# Patient Record
Sex: Male | Born: 1965 | Race: White | Hispanic: No | Marital: Single | Smoking: Never smoker
Health system: Southern US, Community
[De-identification: ages and names within clinical notes are randomized; demographics above are authoritative.]

## PROBLEM LIST (undated history)

## (undated) DIAGNOSIS — E785 Hyperlipidemia, unspecified: Secondary | ICD-10-CM

## (undated) DIAGNOSIS — M542 Cervicalgia: Secondary | ICD-10-CM

## (undated) DIAGNOSIS — M545 Low back pain: Secondary | ICD-10-CM

## (undated) HISTORY — DX: Low back pain: M54.5

## (undated) HISTORY — DX: Hyperlipidemia, unspecified: E78.5

## (undated) HISTORY — DX: Cervicalgia: M54.2

---

## 1996-05-17 HISTORY — PX: INGUINAL HERNIA REPAIR: SHX194

## 1998-01-10 ENCOUNTER — Encounter: Admission: RE | Admit: 1998-01-10 | Discharge: 1998-01-10 | Payer: Self-pay | Admitting: Sports Medicine

## 1999-08-16 ENCOUNTER — Ambulatory Visit (HOSPITAL_COMMUNITY): Admission: RE | Admit: 1999-08-16 | Discharge: 1999-08-16 | Payer: Self-pay | Admitting: Neurosurgery

## 1999-08-16 ENCOUNTER — Encounter: Payer: Self-pay | Admitting: Neurosurgery

## 2000-10-18 ENCOUNTER — Ambulatory Visit (HOSPITAL_BASED_OUTPATIENT_CLINIC_OR_DEPARTMENT_OTHER): Admission: RE | Admit: 2000-10-18 | Discharge: 2000-10-18 | Payer: Self-pay | Admitting: Family Medicine

## 2003-02-06 ENCOUNTER — Encounter: Payer: Self-pay | Admitting: Emergency Medicine

## 2003-02-06 ENCOUNTER — Emergency Department (HOSPITAL_COMMUNITY): Admission: EM | Admit: 2003-02-06 | Discharge: 2003-02-06 | Payer: Self-pay | Admitting: Emergency Medicine

## 2004-04-27 ENCOUNTER — Ambulatory Visit: Payer: Self-pay | Admitting: Pulmonary Disease

## 2004-11-27 ENCOUNTER — Encounter: Admission: RE | Admit: 2004-11-27 | Discharge: 2004-11-27 | Payer: Self-pay | Admitting: Sports Medicine

## 2004-11-27 ENCOUNTER — Ambulatory Visit: Payer: Self-pay | Admitting: Sports Medicine

## 2004-12-01 ENCOUNTER — Encounter: Admission: RE | Admit: 2004-12-01 | Discharge: 2004-12-01 | Payer: Self-pay | Admitting: Sports Medicine

## 2005-01-08 ENCOUNTER — Ambulatory Visit: Payer: Self-pay | Admitting: Sports Medicine

## 2005-05-17 DIAGNOSIS — M545 Low back pain, unspecified: Secondary | ICD-10-CM

## 2005-05-17 HISTORY — DX: Low back pain, unspecified: M54.50

## 2011-01-27 ENCOUNTER — Telehealth: Payer: Self-pay | Admitting: Family Medicine

## 2011-01-27 ENCOUNTER — Ambulatory Visit (INDEPENDENT_AMBULATORY_CARE_PROVIDER_SITE_OTHER): Payer: PRIVATE HEALTH INSURANCE | Admitting: Family Medicine

## 2011-01-27 ENCOUNTER — Encounter: Payer: Self-pay | Admitting: Family Medicine

## 2011-01-27 VITALS — BP 112/70 | HR 39 | Temp 97.5°F | Ht 68.75 in | Wt 175.0 lb

## 2011-01-27 DIAGNOSIS — Z0000 Encounter for general adult medical examination without abnormal findings: Secondary | ICD-10-CM | POA: Insufficient documentation

## 2011-01-27 DIAGNOSIS — M7540 Impingement syndrome of unspecified shoulder: Secondary | ICD-10-CM

## 2011-01-27 DIAGNOSIS — M719 Bursopathy, unspecified: Secondary | ICD-10-CM

## 2011-01-27 LAB — COMPREHENSIVE METABOLIC PANEL
AST: 34 U/L (ref 0–37)
Albumin: 4.8 g/dL (ref 3.5–5.2)
BUN: 16 mg/dL (ref 6–23)
CO2: 29 mEq/L (ref 19–32)
Calcium: 9.5 mg/dL (ref 8.4–10.5)
Chloride: 102 mEq/L (ref 96–112)
Glucose, Bld: 92 mg/dL (ref 70–99)
Potassium: 4.6 mEq/L (ref 3.5–5.1)

## 2011-01-27 LAB — CBC WITH DIFFERENTIAL/PLATELET
Basophils Absolute: 0 10*3/uL (ref 0.0–0.1)
Eosinophils Absolute: 0.1 10*3/uL (ref 0.0–0.7)
HCT: 42.5 % (ref 39.0–52.0)
Lymphs Abs: 1.3 10*3/uL (ref 0.7–4.0)
MCHC: 33.6 g/dL (ref 30.0–36.0)
MCV: 98.2 fl (ref 78.0–100.0)
Monocytes Absolute: 0.4 10*3/uL (ref 0.1–1.0)
Platelets: 181 10*3/uL (ref 150.0–400.0)
RDW: 13.5 % (ref 11.5–14.6)

## 2011-01-27 LAB — TSH: TSH: 1.09 u[IU]/mL (ref 0.35–5.50)

## 2011-01-27 LAB — LIPID PANEL: VLDL: 9.8 mg/dL (ref 0.0–40.0)

## 2011-01-27 MED ORDER — FLUTICASONE PROPIONATE (INHAL) 100 MCG/BLIST IN AEPB: 1.0000 | INHALATION_SPRAY | Freq: Two times a day (BID) | RESPIRATORY_TRACT | Status: DC

## 2011-01-27 MED ORDER — ALBUTEROL 90 MCG/ACT IN AERS: 2.0000 | INHALATION_SPRAY | RESPIRATORY_TRACT | Status: DC | PRN

## 2011-01-27 MED ORDER — TRIAMCINOLONE ACETONIDE(NASAL) 55 MCG/ACT NA INHA: 2.0000 | Freq: Every day | NASAL | Status: DC

## 2011-01-27 NOTE — Progress Notes (Signed)
Office Note 01/27/2011  CC:  Chief Complaint  Patient presents with  . Annual Exam    last CPE >94yr, <2 yr  . Shoulder Pain    right    HPI:  Antonio Paul is a 45 y.o. White male who is here to establish care, get CPE, and discuss right shoulder pain. Patient's most recent primary MD: Dr. Izola Price at [facility] [place] Old records were not reviewed prior to or during today's visit.  Has been over a year since last CPE.  He is in excellent shape.  Participates in triathalons.  He has a hx of asthma that has been mild and very well controlled in recent years.  Was on advair at one time but heard some negative press and asked to switch to inhaled steroid alone.  He has been doing well on flovent (?strength) 1 puff qhs and rarely has to use rescue inhaler.  Notes recurrent right shoulder pain: started after/during swimming, without dislocation or traumatic injury. Hurt 1-2 days constantly after swimming so he would stop swimming for a week or two and it would go away.  Then it would return when he started swimming again. At this point it doesn't hurt at rest unless he has been swimming that day or the day prior.  Occasionally hurts some when trying to sleep at night, can't get comfortable. Has taken some NSAIDs without much effect, has been resting the shoulder lately. Reports hx of left shoulder pain in past but describes being dx'd with [facility] joint arthritis by Dr. Charlett Blake (ortho), and this got better with NSAIDs.   Past Medical History  Diagnosis Date  . Asthma   . Allergic rhinitis   . Low back pain 2007    hx of HNP L-spine, PT helped  . Neck pain     distant hx of neck pain; dx'd with 2 fused cervical vertebrae by Dr. Channing Mutters, no surgery required    Past Surgical History  Procedure Date  . Inguinal hernia repair 2000    Right    Family History  Problem Relation Age of Onset  . COPD Mother   . Heart disease Father   . Cancer Sister     d. breast cancer  . Heart disease  Paternal Grandfather   . Diabetes Paternal Grandfather   No FH of colon cancer or prostate cancer.  History   Social History  . Marital Status: Single    Spouse Name: N/A    Number of Children: N/A  . Years of Education: N/A   Occupational History  . Not on file.   Social History Main Topics  . Smoking status: Never Smoker   . Smokeless tobacco: Former Neurosurgeon  . Alcohol Use: Yes     occasional  . Drug Use: No  . Sexually Active: Not on file   Other Topics Concern  . 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  He eats an excellent diet: low fat, low chol.  Outpatient Encounter Prescriptions as of 01/27/2011  Medication Sig Dispense Refill  . Albuterol Sulfate (PROVENTIL HFA IN) Inhale 2 puffs into the lungs every 4 (four) hours as needed.        . cyanocobalamin (BL VITAMIN B-12) 500 MCG tablet Take 500 mcg by mouth daily.        Marland Kitchen  Multiple Vitamin (MULTIVITAMIN) tablet Take 1 tablet by mouth daily.        . Omega-3 Fatty Acids (FISH OIL) 1000 MG CAPS Take 2 capsules by mouth daily.        Marland Kitchen triamcinolone (NASACORT AQ) 55 MCG/ACT nasal inhaler Place 2 sprays into the nose daily.        Flovent diskus: pt does not recall strength but takes only 1 puff qhs  Allergies  Allergen Reactions  . Penicillins     ROS Review of Systems  Constitutional: Negative for fever, chills, appetite change and fatigue.  HENT: Negative for ear pain, congestion, sore throat, neck stiffness and dental problem.   Eyes: Negative for discharge, redness and visual disturbance.  Respiratory: Negative for cough, chest tightness, shortness of breath and wheezing.   Cardiovascular: Negative for chest pain, palpitations and leg swelling.  Gastrointestinal: Negative for nausea, vomiting, abdominal pain, diarrhea and blood in  stool.  Genitourinary: Negative for dysuria, urgency, frequency, hematuria, flank pain and difficulty urinating.  Musculoskeletal: Positive for arthralgias (right shoulder, as per HPI). Negative for myalgias, back pain and joint swelling.  Skin: Negative for pallor and rash.  Neurological: Negative for dizziness, speech difficulty, weakness and headaches.  Hematological: Negative for adenopathy. Does not bruise/bleed easily.  Psychiatric/Behavioral: Negative for confusion and sleep disturbance. The patient is not nervous/anxious.      PE; Blood pressure 112/70, pulse 39, temperature 97.5 F (36.4 C), temperature source Oral, height 5' 8.75" (1.746 m), weight 175 lb (79.379 kg), SpO2 98.00%. Gen: Alert, well appearing.  Patient is oriented to person, place, time, and situation. HEENT: Scalp without lesions or hair loss.  Ears: EACs clear, normal epithelium.  TMs with good light reflex and landmarks bilaterally.  Eyes: no injection, icteris, swelling, or exudate.  EOMI, PERRLA.  Fundoscopy: clear disc margins, normal appearing retinal vasculature. Nose: no drainage or turbinate edema/swelling.  No injection or focal lesion.  Mouth: lips without lesion/swelling.  Oral mucosa pink and moist.  Dentition intact and without obvious caries or gingival swelling.  Oropharynx without erythema, exudate, or swelling.  Neck: supple, ROM full.  Carotids 2+ bilat, without bruit.  No lymphadenopathy, thyromegaly, or mass. Chest: symmetric expansion, nonlabored respirations.  Clear and equal breath sounds in all lung fields.   CV: Regular rhythm, bradycardia (rate 40s), no m/r/g.  Peripheral pulses 2+ and symmetric. ABD: soft, NT, ND, BS normal.  No hepatospenomegaly or mass.  No bruits. EXT: no clubbing, cyanosis, or edema.  Neuro: CN 2-12 intact bilaterally, strength 5/5 in proximal and distal upper extremities and lower extremities bilaterally.  No sensory deficits.  No tremor.  No disdiadochokinesis.  No  ataxia.  Upper extremity and lower extremity DTRs symmetric.  No pronator drift. Skin - no sores or suspicious lesions or rashes or color changes MUSCULOSKELETAL: shoulders symmetric, without deformity or muscle atrophy.  Right shoulder without any TTP.  Pain with right shoulder ABduction, +impingement sign. ER/IR and flexion/extension elicit no pain.  Strength 5/5 prox/dist UE's bilat.  No sensory deficits.  Pertinent labs:  None today  ASSESSMENT AND PLAN:   Health maintenance examination Healthy and fit. Discussed screening/health maintenance appropriate for age.  He deferred testicular exam today. Continue excellent exercise habits and excellent diet. Check FLP, CBC, CMET, TSH. His asthma is well controlled.  Will refill current meds.  Rotator cuff impingement syndrome Mild sx's at this time.  Discussed regular anti-inflammitory use for 2 wks, light resistance ROM exercises, gradually get back into swimming. He  declined steroid injection and PT referral today but will call/return if he changes his mind or if things worsen/change. I gave samples (#15 caps) of 200mg  celebrex for him to take 1 daily until they are gone.  He can ask for more samples to take qd prn in the near future. Therapeutic expectations and side effect profile of medication discussed today.  Patient's questions answered.      Return in about 1 year (around 01/27/2012) for CPE.

## 2011-01-27 NOTE — Assessment & Plan Note (Addendum)
Healthy and fit. Discussed screening/health maintenance appropriate for age.  He deferred testicular exam today. Continue excellent exercise habits and excellent diet. Check FLP, CBC, CMET, TSH. His asthma is well controlled.  Will refill current meds.

## 2011-01-27 NOTE — Assessment & Plan Note (Signed)
Mild sx's at this time.  Discussed regular anti-inflammitory use for 2 wks, light resistance ROM exercises, gradually get back into swimming. He declined steroid injection and PT referral today but will call/return if he changes his mind or if things worsen/change. I gave samples (#15 caps) of 200mg  celebrex for him to take 1 daily until they are gone.  He can ask for more samples to take qd prn in the near future. Therapeutic expectations and side effect profile of medication discussed today.  Patient's questions answered.

## 2011-01-27 NOTE — Telephone Encounter (Signed)
Pls request records from Dr. Izola Price at [facility] [place].  Thx--PM

## 2011-01-29 ENCOUNTER — Other Ambulatory Visit: Payer: Self-pay | Admitting: Family Medicine

## 2011-01-29 DIAGNOSIS — E785 Hyperlipidemia, unspecified: Secondary | ICD-10-CM

## 2011-01-29 NOTE — Progress Notes (Signed)
Order placed--PM 

## 2011-02-01 ENCOUNTER — Other Ambulatory Visit: Payer: PRIVATE HEALTH INSURANCE

## 2011-02-01 DIAGNOSIS — E785 Hyperlipidemia, unspecified: Secondary | ICD-10-CM

## 2011-02-03 LAB — NMR LIPOPROFILE WITHOUT LIPIDS
HDL Particle Number: 37.7 umol/L
LDL Particle Number: 1489 nmol/L — ABNORMAL HIGH
LDL Size: 22.2 nm
LP-IR Score: 58 — ABNORMAL HIGH
Small LDL Particle Number: <90 nmol/L

## 2011-02-03 NOTE — Progress Notes (Signed)
Quick Note:  Pls notify: His advance lipid testing did show that he would benefit from lowering his cholesterol, especially with his family history of heart disease.  I recommend starting generic lipitor 20mg  once daily, repeat lipids/NMR in 48mo. If he's ok with this then let me know and I'll do eRx--PM ______

## 2011-02-05 ENCOUNTER — Other Ambulatory Visit: Payer: Self-pay | Admitting: Family Medicine

## 2011-02-05 MED ORDER — ATORVASTATIN CALCIUM 20 MG PO TABS: 20.0000 mg | ORAL_TABLET | Freq: Every day | ORAL | Status: DC

## 2011-02-09 NOTE — Telephone Encounter (Signed)
Faxed request

## 2011-02-25 ENCOUNTER — Encounter: Payer: Self-pay | Admitting: Family Medicine

## 2012-03-16 ENCOUNTER — Other Ambulatory Visit: Payer: Self-pay | Admitting: Family Medicine

## 2012-03-17 NOTE — Telephone Encounter (Signed)
eScribe request for refill on NASACORT, FLOVENT Last filled - 01/27/11 Last seen on - 01/27/11 RX sent with message needs appt for more refills.

## 2012-09-29 ENCOUNTER — Other Ambulatory Visit: Payer: Self-pay | Admitting: Family Medicine

## 2012-09-29 NOTE — Telephone Encounter (Signed)
Denial of Rx request-Pt needs Appointment--Patient last OV 09.12.2012/SLS

## 2012-10-02 ENCOUNTER — Other Ambulatory Visit: Payer: Self-pay | Admitting: Family Medicine

## 2012-10-02 MED ORDER — FLUTICASONE PROPIONATE (INHAL) 100 MCG/BLIST IN AEPB: 1.0000 | INHALATION_SPRAY | Freq: Two times a day (BID) | RESPIRATORY_TRACT | Status: DC

## 2012-10-02 NOTE — Telephone Encounter (Signed)
Refill request for Flovent Diskus Last filled by MD on - 03/17/12 #60 x0 Last seen-01/27/11 F/U-10/05/12 Please advise refill?

## 2012-10-05 ENCOUNTER — Ambulatory Visit (INDEPENDENT_AMBULATORY_CARE_PROVIDER_SITE_OTHER): Payer: PRIVATE HEALTH INSURANCE | Admitting: Family Medicine

## 2012-10-05 ENCOUNTER — Encounter: Payer: Self-pay | Admitting: Family Medicine

## 2012-10-05 ENCOUNTER — Telehealth: Payer: Self-pay | Admitting: Family Medicine

## 2012-10-05 VITALS — BP 128/88 | HR 44 | Temp 98.3°F | Ht 68.75 in | Wt 191.2 lb

## 2012-10-05 DIAGNOSIS — Z0000 Encounter for general adult medical examination without abnormal findings: Secondary | ICD-10-CM

## 2012-10-05 LAB — COMPREHENSIVE METABOLIC PANEL
ALT: 19 U/L (ref 0–53)
AST: 25 U/L (ref 0–37)
Alkaline Phosphatase: 42 U/L (ref 39–117)
BUN: 12 mg/dL (ref 6–23)
Calcium: 9.4 mg/dL (ref 8.4–10.5)
Chloride: 103 mEq/L (ref 96–112)
Creatinine, Ser: 0.8 mg/dL (ref 0.4–1.5)
Total Bilirubin: 0.8 mg/dL (ref 0.3–1.2)

## 2012-10-05 LAB — LIPID PANEL
Cholesterol: 235 mg/dL — ABNORMAL HIGH (ref 0–200)
HDL: 60.7 mg/dL (ref >39.00)
Total CHOL/HDL Ratio: 4
VLDL: 11.4 mg/dL (ref 0.0–40.0)

## 2012-10-05 LAB — CBC WITH DIFFERENTIAL/PLATELET
Basophils Relative: 1.2 % (ref 0.0–3.0)
Eosinophils Relative: 3.8 % (ref 0.0–5.0)
HCT: 42.3 % (ref 39.0–52.0)
MCV: 94.9 fl (ref 78.0–100.0)
Monocytes Absolute: 0.5 10*3/uL (ref 0.1–1.0)
Monocytes Relative: 11.9 % (ref 3.0–12.0)
Neutrophils Relative %: 50.3 % (ref 43.0–77.0)
RBC: 4.46 Mil/uL (ref 4.22–5.81)
WBC: 4.4 10*3/uL — ABNORMAL LOW (ref 4.5–10.5)

## 2012-10-05 MED ORDER — BUDESONIDE 180 MCG/ACT IN AEPB: 2.0000 | INHALATION_SPRAY | Freq: Every day | RESPIRATORY_TRACT | Status: DC

## 2012-10-05 NOTE — Assessment & Plan Note (Signed)
Reviewed age and gender appropriate health maintenance issues (prudent diet, regular exercise, health risks of tobacco and excessive alcohol, use of seatbelts, fire alarms in home, use of sunscreen).  Also reviewed age and gender appropriate health screening as well as vaccine recommendations. UTD on vaccines. Health panel labs done today. Gave pulmicort flexhaler sample today and we'll work on getting his Flovent approved through his insurer.

## 2012-10-05 NOTE — Telephone Encounter (Signed)
Patient informed, understood & agreed/SLS  

## 2012-10-05 NOTE — Progress Notes (Signed)
OFFICE NOTE  10/05/2012  CC:  Chief Complaint  Patient presents with  . Annual Exam     HPI: Patient is a 47 y.o. Caucasian male who is here for CPE.   Feeling very well.  Biking some, running some.  Was on vegan diet instead of trying the statin I rx'd for him and he lost some weight but then in the last 64mo has "fallen off" the diet and gained wt back--he is up 16 lb from last visit 01/2011. He has been having just a bit of chest tightness on and off lately, ran out of flovent----and we are working on getting him back on this.  Pertinent PMH:  Past Medical History  Diagnosis Date  . Asthma   . Allergic rhinitis   . Low back pain 2007    hx of HNP L-spine, PT helped  . Neck pain     distant hx of neck pain; dx'd with 2 fused cervical vertebrae by Dr. Channing Mutters, no surgery required  . Hyperlipidemia    Past Surgical History  Procedure Laterality Date  . Inguinal hernia repair  1998    Right   Family History  Problem Relation Age of Onset  . COPD Mother   . Heart disease Father   . Cancer Sister     d. breast cancer  . Heart disease Paternal Grandfather   . Diabetes Paternal Grandfather    History   Social History  . Marital Status: Single    Spouse Name: N/A    Number of Children: N/A  . Years of Education: N/A   Social History Main Topics  . Smoking status: Never Smoker   . Smokeless tobacco: Former Neurosurgeon  . Alcohol Use: Yes     Comment: occasional  . Drug Use: No  . Sexually Active: None   Other Topics Concern  . None   Social History Narrative   Married, has a 56 y/o daughter.   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   Grew up in [place], attended [employer], [employer] (masters).   He is a triathelete: runs, swims, bikes regularly.   No T/A/Ds.    MEDS:  Outpatient Prescriptions Prior to Visit  Medication Sig Dispense Refill  . albuterol (PROVENTIL,VENTOLIN) 90 MCG/ACT inhaler Inhale 2 puffs into the lungs every 4 (four) hours as needed for wheezing.  17 g   1  . cyanocobalamin (BL VITAMIN B-12) 500 MCG tablet Take 500 mcg by mouth daily.        . Fluticasone Propionate, Inhal, (FLOVENT DISKUS) 100 MCG/BLIST AEPB Inhale 1 puff into the lungs 2 (two) times daily. MUST HAVE APPT FOR MORE REFILLS.  60 each  0  . Multiple Vitamin (MULTIVITAMIN) tablet Take 1 tablet by mouth daily.        Marland Kitchen triamcinolone (NASACORT) 55 MCG/ACT nasal inhaler Place 2 sprays into the nose at bedtime. MUST HAVE APPT FOR MORE REFILLS.  16.5 g  0  . atorvastatin (LIPITOR) 20 MG tablet Take 1 tablet (20 mg total) by mouth daily.  30 tablet  3  . Omega-3 Fatty Acids (FISH OIL) 1000 MG CAPS Take 2 capsules by mouth daily.         No facility-administered medications prior to visit.   Review of Systems  Constitutional: Negative for fever, chills, appetite change and fatigue.  HENT: Negative for ear pain, congestion, sore throat, neck stiffness and dental problem.   Eyes: Negative for discharge, redness and visual disturbance.  Respiratory: Negative for  cough, chest tightness, shortness of breath and wheezing.   Cardiovascular: Negative for chest pain, palpitations and leg swelling.  Gastrointestinal: Negative for nausea, vomiting, abdominal pain, diarrhea and blood in stool.  Genitourinary: Negative for dysuria, urgency, frequency, hematuria, flank pain and difficulty urinating.  Musculoskeletal: Negative for myalgias, back pain, joint swelling and arthralgias.  Skin: Negative for pallor and rash.  Neurological: Negative for dizziness, speech difficulty, weakness and headaches.  Hematological: Negative for adenopathy. Does not bruise/bleed easily.  Psychiatric/Behavioral: Negative for confusion and sleep disturbance. The patient is not nervous/anxious.       PE: Blood pressure 128/88, pulse 44, temperature 98.3 F (36.8 C), temperature source Oral, height 5' 8.75" (1.746 m), weight 191 lb 4 oz (86.75 kg), SpO2 96.00%. Gen: Alert, well appearing.  Patient is oriented to  person, place, time, and situation. AFFECT: pleasant, lucid thought and speech. ENT: Ears: EACs clear, normal epithelium.  TMs with good light reflex and landmarks bilaterally.  Eyes: no injection, icteris, swelling, or exudate.  EOMI, PERRLA. Nose: no drainage or turbinate edema/swelling.  No injection or focal lesion.  Mouth: lips without lesion/swelling.  Oral mucosa pink and moist.  Dentition intact and without obvious caries or gingival swelling.  Oropharynx without erythema, exudate, or swelling.  Neck: supple/nontender.  No LAD, mass, or TM.  Carotid pulses 2+ bilaterally, without bruits. CV: Regular, bradycardic, no m/r/g.   LUNGS: CTA bilat, nonlabored resps, good aeration in all lung fields. ABD: soft, NT, ND, BS normal.  No hepatospenomegaly or mass.  No bruits. EXT: no clubbing, cyanosis, or edema.  Musculoskeletal: no joint swelling, erythema, warmth, or tenderness.  ROM of all joints intact. Skin - no sores or suspicious lesions or rashes or color changes Neuro: CN 2-12 intact bilaterally, strength 5/5 in proximal and distal upper extremities and lower extremities bilaterally.  No sensory deficits.  No tremor.  No disdiadochokinesis.  No ataxia.  Upper extremity and lower extremity DTRs symmetric.  No pronator drift. Genitals normal; both testes normal without tenderness, masses, hydroceles, varicoceles, erythema or swelling. Shaft normal, circumcised, meatus normal without discharge. No inguinal hernia noted. No inguinal lymphadenopathy.  LAB: none today  IMPRESSION AND PLAN:  Reviewed age and gender appropriate health maintenance issues (prudent diet, regular exercise, health risks of tobacco and excessive alcohol, use of seatbelts, fire alarms in home, use of sunscreen).  Also reviewed age and gender appropriate health screening as well as vaccine recommendations.  An After Visit Summary was printed and given to the patient.  FOLLOW UP: 1 yr

## 2012-10-05 NOTE — Telephone Encounter (Signed)
Pls notify pt that his insurance formulary prefers pulmicort flexhaler--the med I gave him a sample of today--2 puffs every day.  I'll send new eRx in and I'll take flovent off his med list.-thx

## 2012-10-06 LAB — LDL CHOLESTEROL, DIRECT: Direct LDL: 154.5 mg/dL

## 2012-11-27 ENCOUNTER — Ambulatory Visit (INDEPENDENT_AMBULATORY_CARE_PROVIDER_SITE_OTHER): Payer: PRIVATE HEALTH INSURANCE | Admitting: Family Medicine

## 2012-11-27 ENCOUNTER — Ambulatory Visit (INDEPENDENT_AMBULATORY_CARE_PROVIDER_SITE_OTHER)
Admission: RE | Admit: 2012-11-27 | Discharge: 2012-11-27 | Disposition: A | Discharge status: Home / Self Care | Payer: PRIVATE HEALTH INSURANCE | Source: Ambulatory Visit | Attending: Family Medicine | Admitting: Family Medicine

## 2012-11-27 ENCOUNTER — Encounter: Payer: Self-pay | Admitting: Family Medicine

## 2012-11-27 VITALS — BP 134/83 | HR 40 | Temp 99.0°F | Resp 16 | Ht 68.75 in | Wt 191.0 lb

## 2012-11-27 DIAGNOSIS — M79609 Pain in unspecified limb: Secondary | ICD-10-CM

## 2012-11-27 DIAGNOSIS — M79673 Pain in unspecified foot: Secondary | ICD-10-CM | POA: Insufficient documentation

## 2012-11-27 DIAGNOSIS — M79672 Pain in left foot: Secondary | ICD-10-CM

## 2012-11-27 NOTE — Progress Notes (Signed)
OFFICE NOTE  11/27/2012  CC:  Chief Complaint  Patient presents with  . Foot Pain    L foot x yesterday     HPI: Patient is a 47 y.o. Caucasian male who is here for pain in left foot.   Stubbed left foot 4th and 5th toes yesterday against a piece of furniture in his home.  No swelling.  Pain not that bad after, no swelling. He noted more soreness last night, esp with walking on it.  Then this morning while at work he felt a sharp pang of pain in the region of the left 4th and 5th metatarsals and thought he should come in to get it checked out. He has not iced it or taken any meds.  No ankle or knee pain.  No heel pain.  No sensory changes in foot, no focal weakness.  Pertinent PMH:  Past Medical History  Diagnosis Date  . Asthma   . Allergic rhinitis   . Low back pain 2007    hx of HNP L-spine, PT helped  . Neck pain     distant hx of neck pain; dx'd with 2 fused cervical vertebrae by Dr. Channing Mutters, no surgery required  . Hyperlipidemia    Past surgical, social, and family history reviewed and no changes noted since last office visit.  MEDS:  Outpatient Prescriptions Prior to Visit  Medication Sig Dispense Refill  . triamcinolone (NASACORT) 55 MCG/ACT nasal inhaler Place 2 sprays into the nose at bedtime. MUST HAVE APPT FOR MORE REFILLS.  16.5 g  0  . albuterol (PROVENTIL,VENTOLIN) 90 MCG/ACT inhaler Inhale 2 puffs into the lungs every 4 (four) hours as needed for wheezing.  17 g  1  . atorvastatin (LIPITOR) 20 MG tablet Take 1 tablet (20 mg total) by mouth daily.  30 tablet  3  . budesonide (PULMICORT FLEXHALER) 180 MCG/ACT inhaler Inhale 2 puffs into the lungs daily.  1 Inhaler  12  . cyanocobalamin (BL VITAMIN B-12) 500 MCG tablet Take 500 mcg by mouth daily.        . Multiple Vitamin (MULTIVITAMIN) tablet Take 1 tablet by mouth daily.        . Omega-3 Fatty Acids (FISH OIL) 1000 MG CAPS Take 2 capsules by mouth daily.         No facility-administered medications prior to visit.     PE: Blood pressure 134/83, pulse 40, temperature 99 F (37.2 C), temperature source Temporal, resp. rate 16, height 5' 8.75" (1.746 m), weight 191 lb (86.637 kg), SpO2 98.00%. Gen: Alert, well appearing.  Patient is oriented to person, place, time, and situation. Left foot: no erythema, bruising, swelling, or other deformity.  ROM of ankle and all toes intact.  DP and PT pulses intact.  Nontender to palpation over entire foot and all toes. When pt stands up to bear wt on the foot, he bends left knee some in response to pain felt in left lateral foot area proximal to the 4th and 5th toes (metatarsal region).  IMPRESSION AND PLAN:  Foot pain Suspect soft tissue injury but need to r/o fracture. X-ray here today:  Ibuprofen 600mg  bid with food recommended. Fitted with post-op shoe in office--dispensed shoe to pt today. Signs/symptoms to call or return for were reviewed and pt expressed understanding.    An After Visit Summary was printed and given to the patient.  FOLLOW UP: prn

## 2012-11-27 NOTE — Assessment & Plan Note (Addendum)
Suspect soft tissue injury but need to r/o fracture. X-ray here today: technical difficulties prevented prelim reading while pt was here today.  Will await radiology reading and call pt with result. Ibuprofen 600mg  bid with food recommended. Fitted with post-op shoe in office--dispensed shoe to pt today. Signs/symptoms to call or return for were reviewed and pt expressed understanding.

## 2013-03-22 ENCOUNTER — Other Ambulatory Visit: Payer: Self-pay

## 2013-08-10 ENCOUNTER — Ambulatory Visit (INDEPENDENT_AMBULATORY_CARE_PROVIDER_SITE_OTHER): Payer: PRIVATE HEALTH INSURANCE | Admitting: Family Medicine

## 2013-08-10 ENCOUNTER — Encounter: Payer: Self-pay | Admitting: Family Medicine

## 2013-08-10 VITALS — BP 137/88 | HR 43 | Temp 98.6°F | Resp 16 | Ht 68.75 in | Wt 192.0 lb

## 2013-08-10 DIAGNOSIS — IMO0002 Reserved for concepts with insufficient information to code with codable children: Secondary | ICD-10-CM

## 2013-08-10 DIAGNOSIS — S8391XA Sprain of unspecified site of right knee, initial encounter: Secondary | ICD-10-CM | POA: Insufficient documentation

## 2013-08-10 NOTE — Progress Notes (Addendum)
OFFICE NOTE  08/10/2013  CC:  Chief Complaint  Patient presents with  . Knee Pain    right knee     HPI: Patient is a 48 y.o. Caucasian male who is here for right knee pain. "Tweaked" his right knee recently doing deep squats with light wts, no swelling but it hurt for a couple days. Over the last 2 wks he has had recurring pain when running, most significantly the last week after he was walking and felt it go a bit unstable briefly.  No meds taken for it.  Has been icing it the last 2 nights.  No signif injury in his knees before.  Pertinent PMH:  Past medical, surgical, social, and family history reviewed and no changes are noted since last office visit.  MEDS:  Outpatient Prescriptions Prior to Visit  Medication Sig Dispense Refill  . budesonide (PULMICORT FLEXHALER) 180 MCG/ACT inhaler Inhale 2 puffs into the lungs daily.  1 Inhaler  12  . cyanocobalamin (BL VITAMIN B-12) 500 MCG tablet Take 500 mcg by mouth daily.        Marland Kitchen. triamcinolone (NASACORT) 55 MCG/ACT nasal inhaler Place 2 sprays into the nose at bedtime. MUST HAVE APPT FOR MORE REFILLS.  16.5 g  0  . albuterol (PROVENTIL,VENTOLIN) 90 MCG/ACT inhaler Inhale 2 puffs into the lungs every 4 (four) hours as needed for wheezing.  17 g  1  . atorvastatin (LIPITOR) 20 MG tablet Take 1 tablet (20 mg total) by mouth daily.  30 tablet  3  . Multiple Vitamin (MULTIVITAMIN) tablet Take 1 tablet by mouth daily.        . Omega-3 Fatty Acids (FISH OIL) 1000 MG CAPS Take 2 capsules by mouth daily.         No facility-administered medications prior to visit.    PE: Blood pressure 137/88, pulse 43, temperature 98.6 F (37 C), temperature source Temporal, resp. rate 16, height 5' 8.75" (1.746 m), weight 192 lb (87.091 kg), SpO2 98.00%. Gen: Alert, well appearing.  Patient is oriented to person, place, time, and situation. Right knee without erythema, warmth, tenderness, or deformity. Patellar grind negative.  McMurray's neg.  No  instability with varus or valgus stress or with lachman's.  IMPRESSION AND PLAN:  Right knee sprain Reassured.  Possibly incompletely subluxed his patella. RICE therapy reiterated. Take one week off of running and any workouts involving right knee.   An After Visit Summary was printed and given to the patient.   FOLLOW UP: prn

## 2013-08-10 NOTE — Progress Notes (Signed)
Pre visit review using our clinic review tool, if applicable. No additional management support is needed unless otherwise documented below in the visit note. 

## 2013-08-21 NOTE — Assessment & Plan Note (Signed)
Reassured.  Possibly incompletely subluxed his patella. RICE therapy reiterated. Take one week off of running and any workouts involving right knee.

## 2014-01-18 ENCOUNTER — Ambulatory Visit (INDEPENDENT_AMBULATORY_CARE_PROVIDER_SITE_OTHER): Payer: PRIVATE HEALTH INSURANCE | Admitting: Family Medicine

## 2014-01-18 ENCOUNTER — Encounter: Payer: Self-pay | Admitting: Family Medicine

## 2014-01-18 VITALS — BP 127/83 | HR 61 | Temp 100.0°F | Resp 18 | Ht 68.75 in | Wt 188.0 lb

## 2014-01-18 DIAGNOSIS — K5289 Other specified noninfective gastroenteritis and colitis: Secondary | ICD-10-CM

## 2014-01-18 DIAGNOSIS — K529 Noninfective gastroenteritis and colitis, unspecified: Secondary | ICD-10-CM

## 2014-01-18 MED ORDER — DIPHENOXYLATE-ATROPINE 2.5-0.025 MG PO TABS: ORAL_TABLET | ORAL | Status: DC

## 2014-01-18 NOTE — Progress Notes (Signed)
OFFICE NOTE  01/18/2014  CC:  Chief Complaint  Patient presents with  . Diarrhea  . Fever    last night  . Headache     HPI: Patient is a 48 y.o. Caucasian male who is here for diarrhea. Onset 4-5 d ago some loose BM's, a few a day, but no n/v or fever. Until last night when he got subjective fever, diarrhea increased, brownish watery--no blood. HA and achy all over the last 12-18h.  No ST.  Some mild stomach burning. No known sick contacts lately. No recent antibiotics.  Took some pepto bismol this morning, advil last night for fever. No uri sx's or cough.  Pertinent PMH:  Past medical, surgical, social, and family history reviewed and no changes are noted since last office visit.  MEDS:  Outpatient Prescriptions Prior to Visit  Medication Sig Dispense Refill  . budesonide (PULMICORT FLEXHALER) 180 MCG/ACT inhaler Inhale 2 puffs into the lungs daily.  1 Inhaler  12  . Multiple Vitamin (MULTIVITAMIN) tablet Take 1 tablet by mouth daily.        . Omega-3 Fatty Acids (FISH OIL) 1000 MG CAPS Take 2 capsules by mouth daily.        Marland Kitchen triamcinolone (NASACORT) 55 MCG/ACT nasal inhaler Place 2 sprays into the nose at bedtime. MUST HAVE APPT FOR MORE REFILLS.  16.5 g  0  . albuterol (PROVENTIL,VENTOLIN) 90 MCG/ACT inhaler Inhale 2 puffs into the lungs every 4 (four) hours as needed for wheezing.  17 g  1  . atorvastatin (LIPITOR) 20 MG tablet Take 1 tablet (20 mg total) by mouth daily.  30 tablet  3  . cyanocobalamin (BL VITAMIN B-12) 500 MCG tablet Take 500 mcg by mouth daily.         No facility-administered medications prior to visit.    PE: Blood pressure 127/83, pulse 61, temperature 100 F (37.8 C), temperature source Temporal, resp. rate 18, height 5' 8.75" (1.746 m), weight 188 lb (85.276 kg), SpO2 99.00%. Gen: alert, tired appearing but NAD.  Nontoxic. ZJQ:DUKR: no injection, icteris, swelling, or exudate.  EOMI, PERRLA. Mouth: lips without lesion/swelling.  Oral mucosa  pink and moist. Oropharynx without erythema, exudate, or swelling.  Neck - No masses or thyromegaly or limitation in range of motion CV: RRR, no m/r/g.   LUNGS: CTA bilat, nonlabored resps, good aeration in all lung fields. ABD: soft, mild discomfort in mid epigastric region with palpation but otherwise nontender.  BS normal.  No distention. EXT: no clubbing, cyanosis, or edema.    IMPRESSION AND PLAN:  Acute gastroenteritis, likely viral. No signif dehydration on exam. Instructions: Take OTC imodium to slow down your diarrhea.  Lomotil rx handed to pt in case imodium ineffective. Take tylenol 1000 mg every 6 hours for pain or fever. Drink lots of clear liquids, eat a bland diet. If still without improvement on Tuesday, return for a stool collection kit.  An After Visit Summary was printed and given to the patient.  FOLLOW UP: prn

## 2014-01-18 NOTE — Patient Instructions (Signed)
Take OTC imodium to slow down your diarrhea. Take tylenol 1000 mg every 6 hours for pain or fever. Drink lots of clear liquids, eat a bland diet. If still without improvement on Tuesday, return for a stool collection kit.

## 2014-01-18 NOTE — Progress Notes (Signed)
Pre visit review using our clinic review tool, if applicable. No additional management support is needed unless otherwise documented below in the visit note. 

## 2014-05-02 ENCOUNTER — Encounter: Payer: Self-pay | Admitting: Family Medicine

## 2014-05-02 ENCOUNTER — Ambulatory Visit (INDEPENDENT_AMBULATORY_CARE_PROVIDER_SITE_OTHER): Payer: PRIVATE HEALTH INSURANCE | Admitting: Family Medicine

## 2014-05-02 VITALS — BP 136/92 | HR 41 | Temp 98.6°F | Resp 16 | Ht 68.75 in | Wt 184.0 lb

## 2014-05-02 DIAGNOSIS — Z0000 Encounter for general adult medical examination without abnormal findings: Secondary | ICD-10-CM

## 2014-05-02 DIAGNOSIS — E785 Hyperlipidemia, unspecified: Secondary | ICD-10-CM | POA: Diagnosis not present

## 2014-05-02 LAB — COMPREHENSIVE METABOLIC PANEL
ALBUMIN: 4.7 g/dL (ref 3.5–5.2)
ALK PHOS: 54 U/L (ref 39–117)
ALT: 20 U/L (ref 0–53)
AST: 31 U/L (ref 0–37)
BUN: 11 mg/dL (ref 6–23)
CALCIUM: 10.2 mg/dL (ref 8.4–10.5)
CHLORIDE: 102 meq/L (ref 96–112)
CO2: 25 meq/L (ref 19–32)
Creatinine, Ser: 0.8 mg/dL (ref 0.4–1.5)
GFR: 108.2 mL/min (ref >60.00)
GLUCOSE: 75 mg/dL (ref 70–99)
POTASSIUM: 5.2 meq/L — AB (ref 3.5–5.1)
SODIUM: 138 meq/L (ref 135–145)
TOTAL PROTEIN: 7.5 g/dL (ref 6.0–8.3)
Total Bilirubin: 0.8 mg/dL (ref 0.2–1.2)

## 2014-05-02 LAB — LIPID PANEL
CHOLESTEROL: 244 mg/dL — AB (ref 0–200)
HDL: 59.7 mg/dL (ref >39.00)
LDL Cholesterol: 173 mg/dL — ABNORMAL HIGH (ref 0–99)
NonHDL: 184.3
TRIGLYCERIDES: 58 mg/dL (ref 0.0–149.0)
Total CHOL/HDL Ratio: 4
VLDL: 11.6 mg/dL (ref 0.0–40.0)

## 2014-05-02 LAB — CBC WITH DIFFERENTIAL/PLATELET
BASOS ABS: 0 10*3/uL (ref 0.0–0.1)
Basophils Relative: 0.6 % (ref 0.0–3.0)
EOS ABS: 0.1 10*3/uL (ref 0.0–0.7)
Eosinophils Relative: 1.3 % (ref 0.0–5.0)
HCT: 43 % (ref 39.0–52.0)
Hemoglobin: 14 g/dL (ref 13.0–17.0)
LYMPHS PCT: 22.5 % (ref 12.0–46.0)
Lymphs Abs: 1.2 10*3/uL (ref 0.7–4.0)
MCHC: 32.6 g/dL (ref 30.0–36.0)
MCV: 97.5 fl (ref 78.0–100.0)
MONO ABS: 0.9 10*3/uL (ref 0.1–1.0)
Monocytes Relative: 17.4 % — ABNORMAL HIGH (ref 3.0–12.0)
NEUTROS PCT: 58.2 % (ref 43.0–77.0)
Neutro Abs: 3.1 10*3/uL (ref 1.4–7.7)
PLATELETS: 192 10*3/uL (ref 150.0–400.0)
RBC: 4.42 Mil/uL (ref 4.22–5.81)
RDW: 13.4 % (ref 11.5–15.5)
WBC: 5.3 10*3/uL (ref 4.0–10.5)

## 2014-05-02 LAB — TSH: TSH: 1.59 u[IU]/mL (ref 0.35–4.50)

## 2014-05-02 NOTE — Progress Notes (Signed)
Pre visit review using our clinic review tool, if applicable. No additional management support is needed unless otherwise documented below in the visit note. 

## 2014-05-02 NOTE — Assessment & Plan Note (Signed)
Reviewed age and gender appropriate health maintenance issues (prudent diet, regular exercise, health risks of tobacco and excessive alcohol, use of seatbelts, fire alarms in home, use of sunscreen).  Also reviewed age and gender appropriate health screening as well as vaccine recommendations. He declined flu vaccine today. HP labs drawn today.

## 2014-05-02 NOTE — Progress Notes (Signed)
Office Note 05/02/2014  CC:  Chief Complaint  Patient presents with  . Annual Exam    fasting    HPI:  Antonio Paul is a 48 y.o. White male who is here for CPE-fasting. Doing well.  Still exercising a lot, rarely requiring albut rescue, not using flovent. Feeling great, no complaints.   Past Medical History  Diagnosis Date  . Asthma   . Allergic rhinitis   . Low back pain 2007    hx of HNP L-spine, PT helped  . Neck pain     distant hx of neck pain; dx'd with 2 fused cervical vertebrae by Dr. Channing Muttersoy, no surgery required  . Hyperlipidemia     Past Surgical History  Procedure Laterality Date  . Inguinal hernia repair  1998    Right    Family History  Problem Relation Age of Onset  . COPD Mother   . Heart disease Father   . Cancer Sister     d. breast cancer  . Heart disease Paternal Grandfather   . Diabetes Paternal Grandfather     History   Social History  . Marital Status: Single    Spouse Name: N/A    Number of Children: N/A  . Years of Education: N/A   Occupational History  . Not on file.   Social History Main Topics  . Smoking status: Never Smoker   . Smokeless tobacco: Former NeurosurgeonUser  . Alcohol Use: Yes     Comment: occasional  . Drug Use: No  . Sexual Activity: Not on file   Other Topics Concern  . Not on file   Social History Narrative   Married, has a teenage daughter.   Occupation: city Production designer, theatre/television/filmmanager [place]   Grew up in [place], attended [employer], [employer] (masters).   He is a triathelete: runs, swims, bikes regularly.   No T/A/Ds.    Outpatient Prescriptions Prior to Visit  Medication Sig Dispense Refill  . Omega-3 Fatty Acids (FISH OIL) 1000 MG CAPS Take 2 capsules by mouth daily.      Marland Kitchen. triamcinolone (NASACORT) 55 MCG/ACT nasal inhaler Place 2 sprays into the nose at bedtime. MUST HAVE APPT FOR MORE REFILLS. 16.5 g 0  . albuterol (PROVENTIL,VENTOLIN) 90 MCG/ACT inhaler Inhale 2 puffs into the lungs every 4 (four) hours as needed  for wheezing. 17 g 1  . atorvastatin (LIPITOR) 20 MG tablet Take 1 tablet (20 mg total) by mouth daily. 30 tablet 3  . budesonide (PULMICORT FLEXHALER) 180 MCG/ACT inhaler Inhale 2 puffs into the lungs daily. 1 Inhaler 12  . cyanocobalamin (BL VITAMIN B-12) 500 MCG tablet Take 500 mcg by mouth daily.      . diphenoxylate-atropine (LOMOTIL) 2.5-0.025 MG per tablet 1-2 tabs po q6h prn 30 tablet 0  . Multiple Vitamin (MULTIVITAMIN) tablet Take 1 tablet by mouth daily.       No facility-administered medications prior to visit.    Allergies  Allergen Reactions  . Penicillins     ROS Review of Systems  Constitutional: Negative for fever, chills, appetite change and fatigue.  HENT: Negative for congestion, dental problem, ear pain and sore throat.   Eyes: Negative for discharge, redness and visual disturbance.  Respiratory: Negative for cough, chest tightness, shortness of breath and wheezing.   Cardiovascular: Negative for chest pain, palpitations and leg swelling.  Gastrointestinal: Negative for nausea, vomiting, abdominal pain, diarrhea and blood in stool.  Genitourinary: Negative for dysuria, urgency, frequency, hematuria, flank pain and  difficulty urinating.  Musculoskeletal: Negative for myalgias, back pain, joint swelling, arthralgias and neck stiffness.  Skin: Negative for pallor and rash.  Neurological: Negative for dizziness, speech difficulty, weakness and headaches.  Hematological: Negative for adenopathy. Does not bruise/bleed easily.  Psychiatric/Behavioral: Negative for confusion and sleep disturbance. The patient is not nervous/anxious.     PE; Blood pressure 136/92, pulse 41, temperature 98.6 F (37 C), temperature source Temporal, resp. rate 16, height 5' 8.75" (1.746 m), weight 184 lb (83.462 kg), SpO2 100 %. Gen: Alert, well appearing.  Patient is oriented to person, place, time, and situation. AFFECT: pleasant, lucid thought and speech. ENT: Ears: EACs clear, normal  epithelium.  TMs with good light reflex and landmarks bilaterally.  Eyes: no injection, icteris, swelling, or exudate.  EOMI, PERRLA. Nose: no drainage or turbinate edema/swelling.  No injection or focal lesion.  Mouth: lips without lesion/swelling.  Oral mucosa pink and moist.  Dentition intact and without obvious caries or gingival swelling.  Oropharynx without erythema, exudate, or swelling.  Neck: supple/nontender.  No LAD, mass, or TM.  Carotid pulses 2+ bilaterally, without bruits. CV: RRR, no m/r/g.   LUNGS: CTA bilat, nonlabored resps, good aeration in all lung fields. ABD: soft, NT, ND, BS normal.  No hepatospenomegaly or mass.  No bruits. EXT: no clubbing, cyanosis, or edema.  Musculoskeletal: no joint swelling, erythema, warmth, or tenderness.  ROM of all joints intact. Skin - no sores or suspicious lesions or rashes or color changes  Pertinent labs:  Lab Results  Component Value Date   WBC 4.4* 10/05/2012   HGB 14.5 10/05/2012   HCT 42.3 10/05/2012   MCV 94.9 10/05/2012   PLT 184.0 10/05/2012   Lab Results  Component Value Date   CHOL 235* 10/05/2012   HDL 60.70 10/05/2012   LDLDIRECT 154.5 10/05/2012   TRIG 57.0 10/05/2012   CHOLHDL 4 10/05/2012     Chemistry      Component Value Date/Time   NA 138 10/05/2012 0905   K 4.1 10/05/2012 0905   CL 103 10/05/2012 0905   CO2 28 10/05/2012 0905   BUN 12 10/05/2012 0905   CREATININE 0.8 10/05/2012 0905      Component Value Date/Time   CALCIUM 9.4 10/05/2012 0905   ALKPHOS 42 10/05/2012 0905   AST 25 10/05/2012 0905   ALT 19 10/05/2012 0905   BILITOT 0.8 10/05/2012 0905     Lab Results  Component Value Date   TSH 1.62 10/05/2012    ASSESSMENT AND PLAN:   Health maintenance examination Reviewed age and gender appropriate health maintenance issues (prudent diet, regular exercise, health risks of tobacco and excessive alcohol, use of seatbelts, fire alarms in home, use of sunscreen).  Also reviewed age and gender  appropriate health screening as well as vaccine recommendations. He declined flu vaccine today. HP labs drawn today.   An After Visit Summary was printed and given to the patient.  FOLLOW UP:  Return in about 1 year (around 05/03/2015) for annual CPE (fasting).

## 2014-09-17 ENCOUNTER — Telehealth: Payer: Self-pay | Admitting: Family Medicine

## 2014-09-17 ENCOUNTER — Other Ambulatory Visit: Payer: Self-pay | Admitting: Family Medicine

## 2014-09-17 MED ORDER — TRIAMCINOLONE ACETONIDE 55 MCG/ACT NA AERO: 2.0000 | INHALATION_SPRAY | Freq: Every day | NASAL | Status: AC

## 2014-09-17 MED ORDER — ALBUTEROL SULFATE HFA 108 (90 BASE) MCG/ACT IN AERS: 2.0000 | INHALATION_SPRAY | RESPIRATORY_TRACT | Status: DC | PRN

## 2014-09-17 NOTE — Telephone Encounter (Signed)
Pt requesting rf of Pulmicort for his allergies.  This medication is not in his med list.  Please advise.

## 2014-09-17 NOTE — Telephone Encounter (Signed)
Sent in RX for albuterol inhaler and nasacort per patient request.

## 2014-09-18 ENCOUNTER — Other Ambulatory Visit: Payer: Self-pay | Admitting: Family Medicine

## 2014-09-18 MED ORDER — BUDESONIDE 180 MCG/ACT IN AEPB: 2.0000 | INHALATION_SPRAY | Freq: Every day | RESPIRATORY_TRACT | Status: DC

## 2014-09-18 NOTE — Telephone Encounter (Signed)
Pulmicort eRx'd to pt's pharmacy.

## 2014-11-06 IMAGING — CR DG FOOT COMPLETE 3+V*L*
3 series · 3 of 3 positions shown · non-contrast
Comparison: None

CLINICAL DATA: Left foot pain, stubbed left fourth and fifth toes
on 11/26/2012

LEFT FOOT - COMPLETE 3+ VIEW

[view not recorded (1 of 3)]
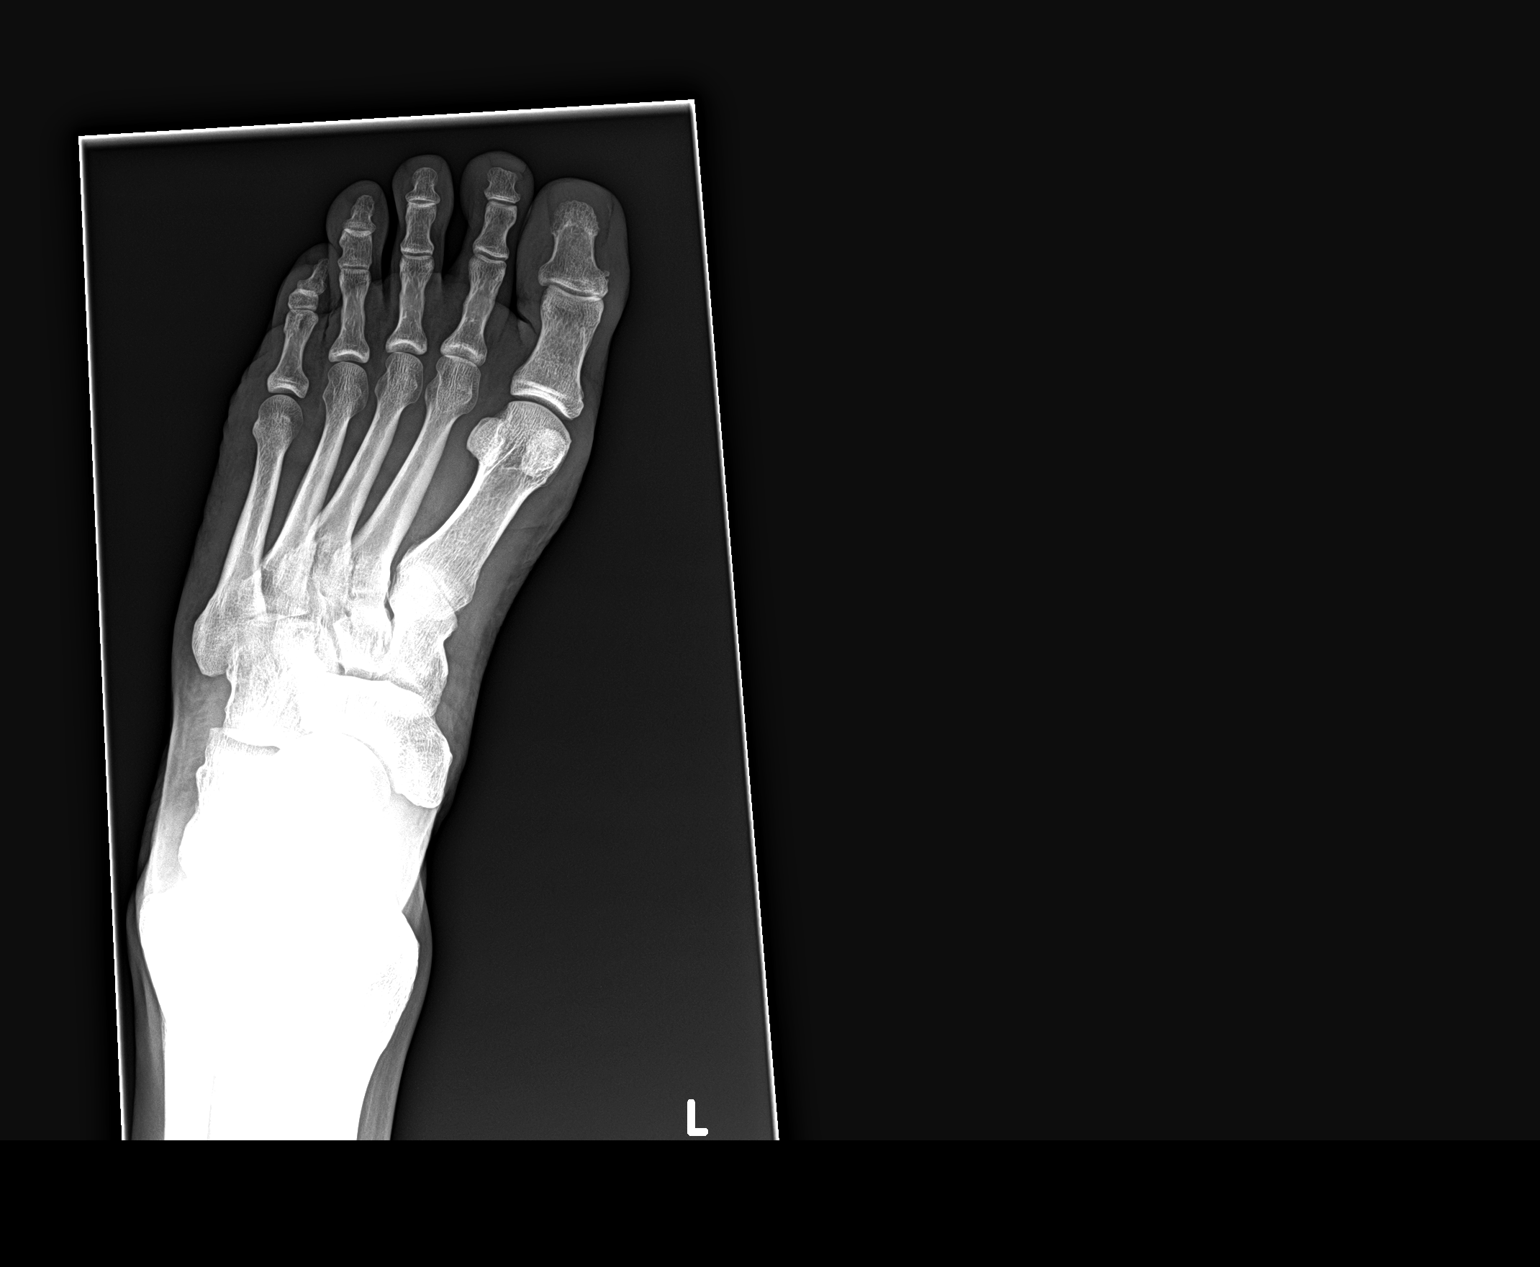

[view not recorded (2 of 3)]
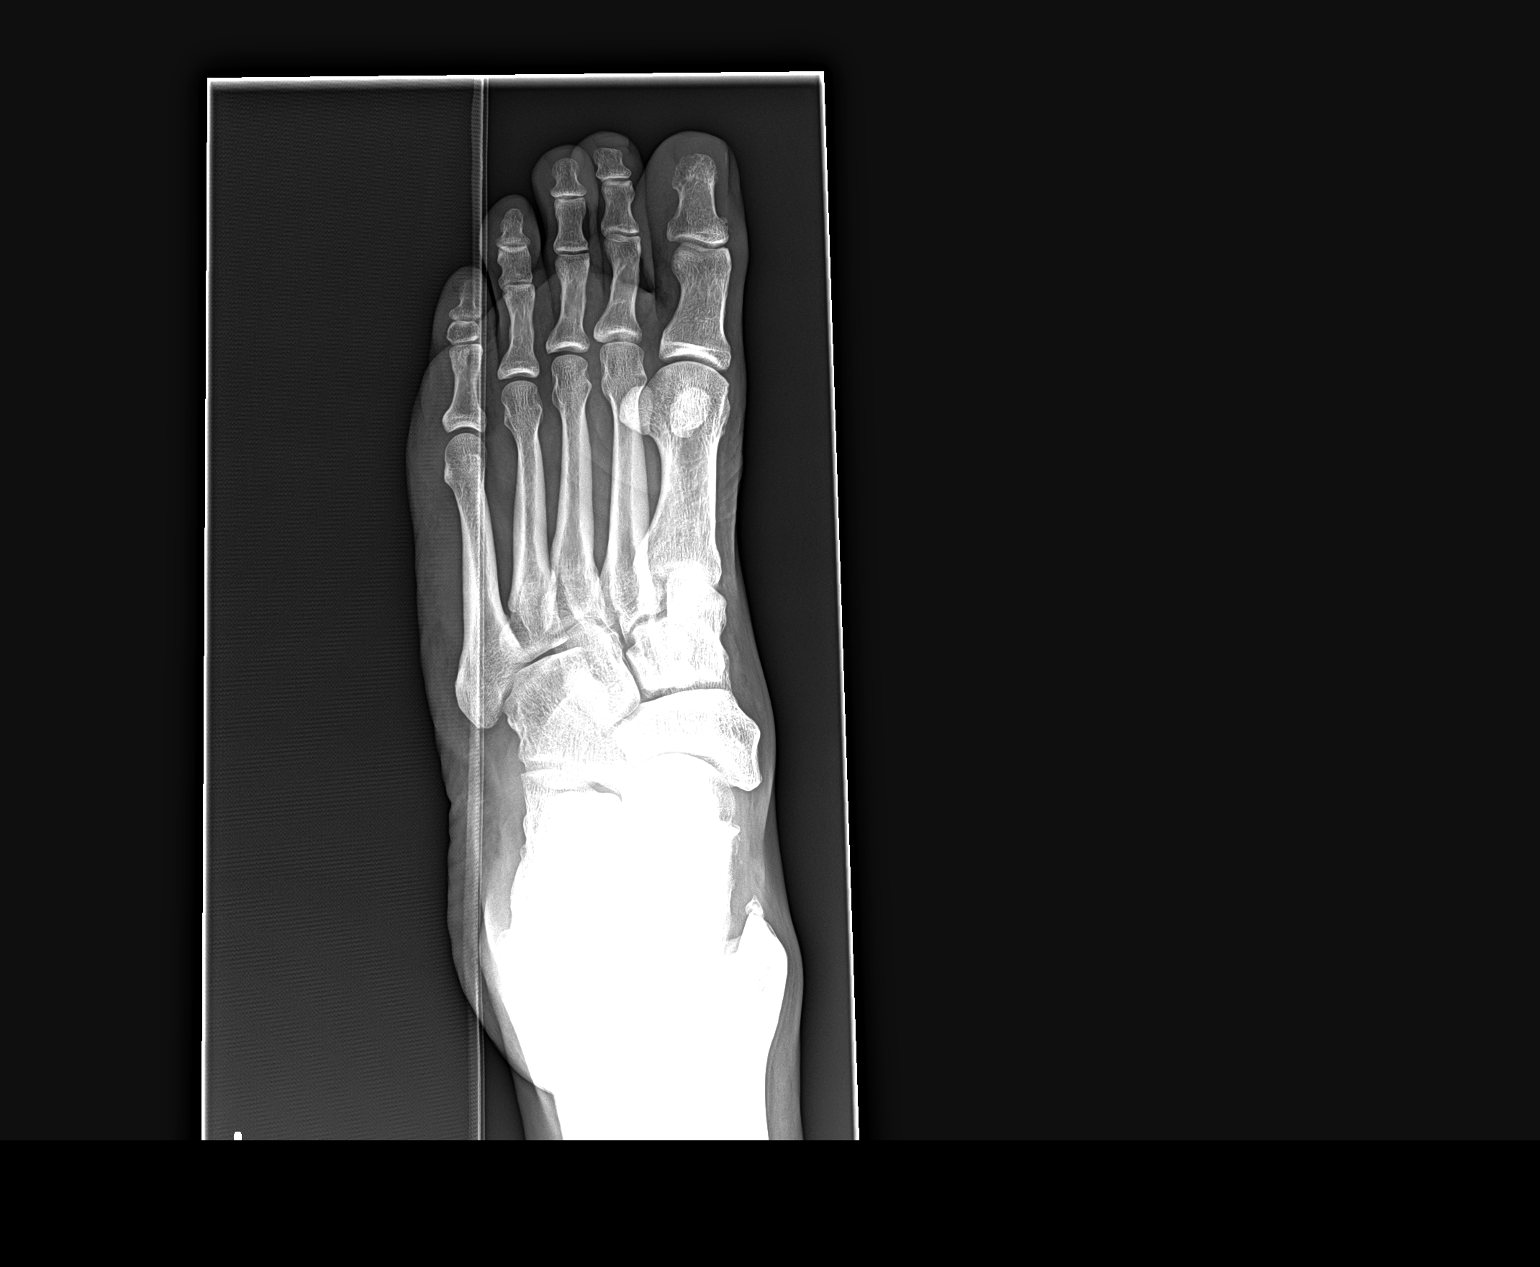

[view not recorded (3 of 3)]
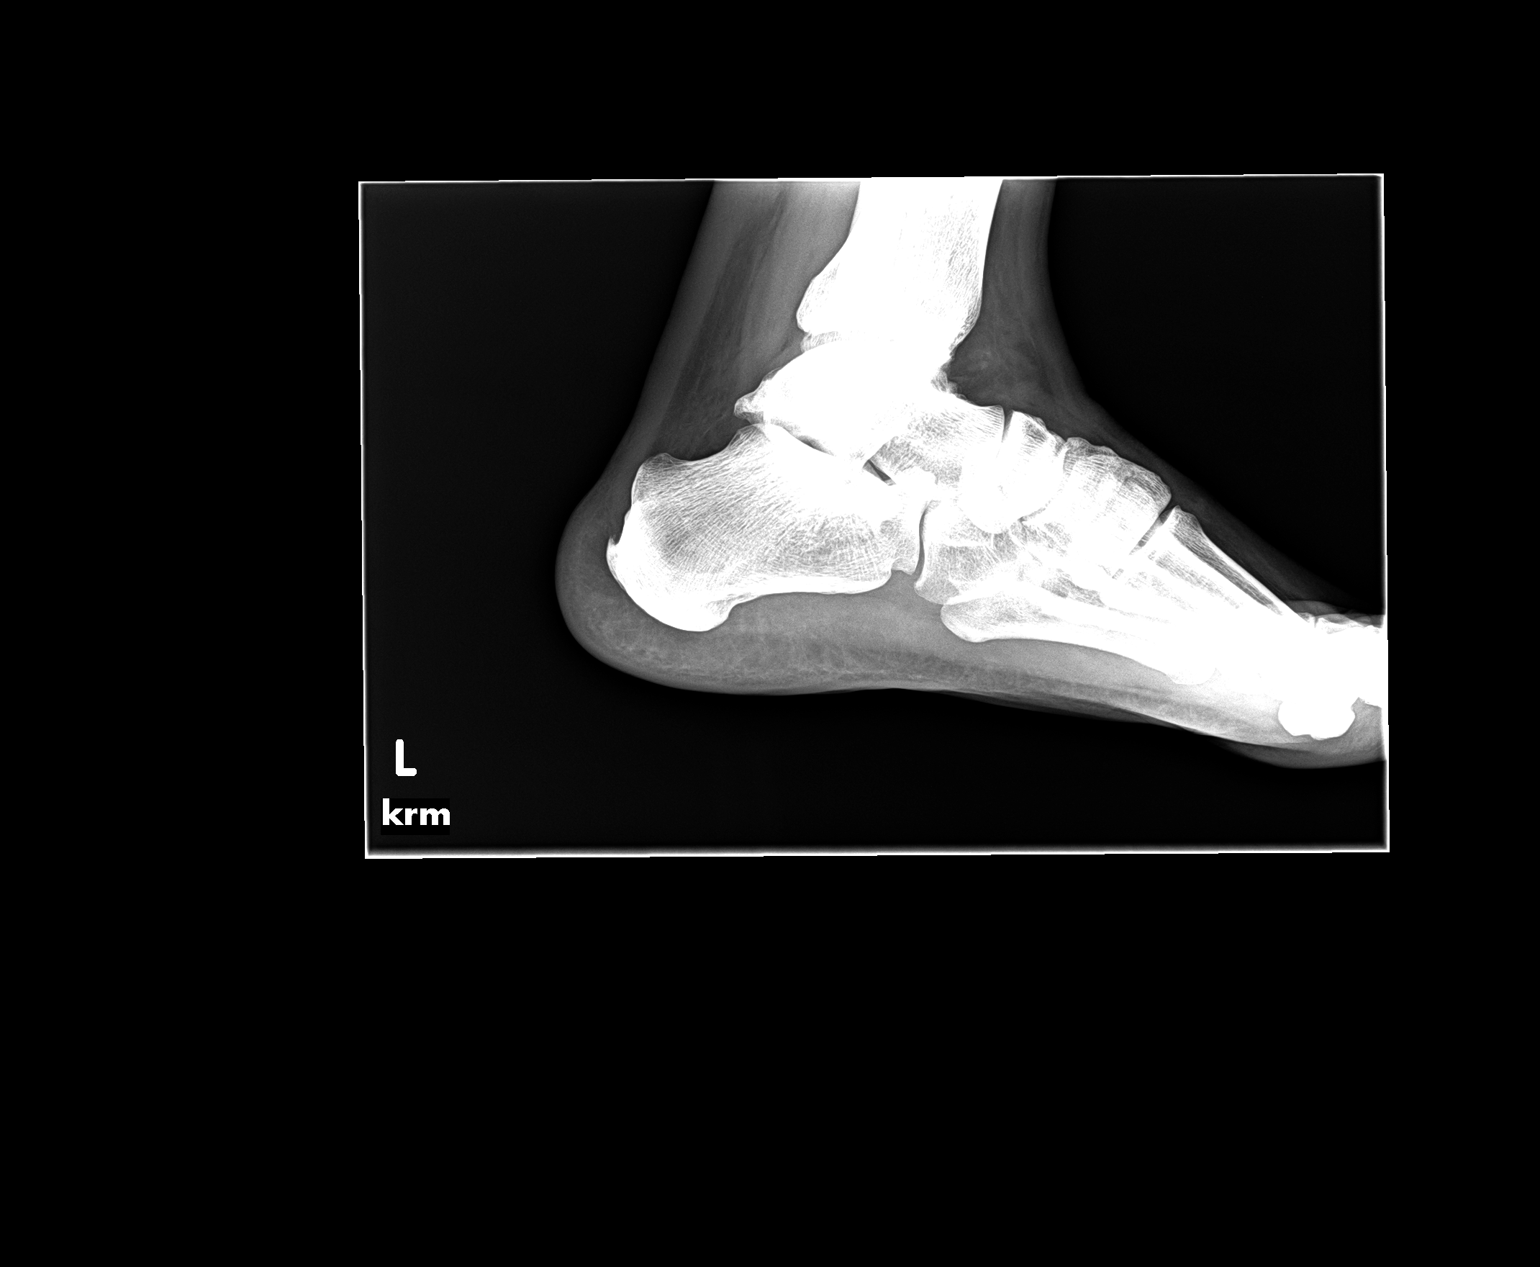

[3 of 3 positions shown; findings below may reference images not displayed]

FINDINGS: Osseous mineralization grossly normal for technique.
Joint spaces preserved.
No definite acute fracture, dislocation or bone destruction.
Portions of toes excluded on lateral view.
IMPRESSION: No definite acute osseous abnormalities.

## 2015-05-06 ENCOUNTER — Encounter: Payer: Self-pay | Admitting: Family Medicine

## 2015-05-06 ENCOUNTER — Ambulatory Visit (INDEPENDENT_AMBULATORY_CARE_PROVIDER_SITE_OTHER): Payer: PRIVATE HEALTH INSURANCE | Admitting: Family Medicine

## 2015-05-06 VITALS — BP 130/83 | HR 71 | Temp 98.1°F | Resp 16 | Ht 68.25 in | Wt 186.5 lb

## 2015-05-06 DIAGNOSIS — Z0000 Encounter for general adult medical examination without abnormal findings: Secondary | ICD-10-CM | POA: Diagnosis not present

## 2015-05-06 DIAGNOSIS — E785 Hyperlipidemia, unspecified: Secondary | ICD-10-CM

## 2015-05-06 DIAGNOSIS — Z125 Encounter for screening for malignant neoplasm of prostate: Secondary | ICD-10-CM

## 2015-05-06 LAB — COMPREHENSIVE METABOLIC PANEL
ALT: 21 U/L (ref 0–53)
AST: 29 U/L (ref 0–37)
Albumin: 4.3 g/dL (ref 3.5–5.2)
Alkaline Phosphatase: 42 U/L (ref 39–117)
BUN: 11 mg/dL (ref 6–23)
CALCIUM: 9.6 mg/dL (ref 8.4–10.5)
CHLORIDE: 103 meq/L (ref 96–112)
CO2: 30 meq/L (ref 19–32)
Creatinine, Ser: 0.8 mg/dL (ref 0.40–1.50)
GFR: 109.3 mL/min (ref >60.00)
Glucose, Bld: 86 mg/dL (ref 70–99)
POTASSIUM: 4.5 meq/L (ref 3.5–5.1)
Sodium: 139 mEq/L (ref 135–145)
Total Bilirubin: 0.6 mg/dL (ref 0.2–1.2)
Total Protein: 6.8 g/dL (ref 6.0–8.3)

## 2015-05-06 LAB — CBC WITH DIFFERENTIAL/PLATELET
Basophils Absolute: 0 10*3/uL (ref 0.0–0.1)
Basophils Relative: 0.6 % (ref 0.0–3.0)
EOS ABS: 0.2 10*3/uL (ref 0.0–0.7)
Eosinophils Relative: 4.5 % (ref 0.0–5.0)
HCT: 40.8 % (ref 39.0–52.0)
Hemoglobin: 13.7 g/dL (ref 13.0–17.0)
Lymphocytes Relative: 38.6 % (ref 12.0–46.0)
Lymphs Abs: 1.6 10*3/uL (ref 0.7–4.0)
MCHC: 33.5 g/dL (ref 30.0–36.0)
MCV: 95.7 fl (ref 78.0–100.0)
Monocytes Absolute: 0.4 10*3/uL (ref 0.1–1.0)
Monocytes Relative: 9.9 % (ref 3.0–12.0)
Neutro Abs: 1.9 10*3/uL (ref 1.4–7.7)
Neutrophils Relative %: 46.4 % (ref 43.0–77.0)
PLATELETS: 212 10*3/uL (ref 150.0–400.0)
RBC: 4.27 Mil/uL (ref 4.22–5.81)
RDW: 13.7 % (ref 11.5–15.5)
WBC: 4 10*3/uL (ref 4.0–10.5)

## 2015-05-06 LAB — LIPID PANEL
Cholesterol: 252 mg/dL — ABNORMAL HIGH (ref 0–200)
HDL: 75.5 mg/dL (ref >39.00)
LDL CALC: 162 mg/dL — AB (ref 0–99)
NonHDL: 176.5
TRIGLYCERIDES: 74 mg/dL (ref 0.0–149.0)
Total CHOL/HDL Ratio: 3
VLDL: 14.8 mg/dL (ref 0.0–40.0)

## 2015-05-06 LAB — TSH: TSH: 1.59 u[IU]/mL (ref 0.35–4.50)

## 2015-05-06 MED ORDER — BUDESONIDE 180 MCG/ACT IN AEPB: 2.0000 | INHALATION_SPRAY | Freq: Every day | RESPIRATORY_TRACT | Status: AC

## 2015-05-06 MED ORDER — ALBUTEROL SULFATE HFA 108 (90 BASE) MCG/ACT IN AERS: 2.0000 | INHALATION_SPRAY | RESPIRATORY_TRACT | Status: AC | PRN

## 2015-05-06 NOTE — Progress Notes (Signed)
Pre visit review using our clinic review tool, if applicable. No additional management support is needed unless otherwise documented below in the visit note. 

## 2015-05-06 NOTE — Progress Notes (Signed)
Office Note 05/06/2015  CC:  Chief Complaint  Patient presents with  . Annual Exam    Pt is fasting. Pt declined the flu shot today.    HPI:  Antonio Paul is a 49 y.o. White male who is here for annual health maintenance exam. Feeling well.   Working out a lot--running, elliptical, nautilus.  Ran out of pulmicort so has had to use his albuterol a few times this month.   Past Medical History  Diagnosis Date  . Asthma   . Allergic rhinitis   . Low back pain 2007    hx of HNP L-spine, PT helped  . Neck pain     distant hx of neck pain; dx'd with 2 fused cervical vertebrae by Dr. Channing Mutters, no surgery required  . Hyperlipidemia     Past Surgical History  Procedure Laterality Date  . Inguinal hernia repair  1998    Right    Family History  Problem Relation Age of Onset  . COPD Mother   . Heart disease Father   . Cancer Sister     d. breast cancer  . Heart disease Paternal Grandfather   . Diabetes Paternal Grandfather     Social History   Social History  . Marital Status: Single    Spouse Name: N/A  . Number of Children: N/A  . Years of Education: N/A   Occupational History  . Not on file.   Social History Main Topics  . Smoking status: Never Smoker   . Smokeless tobacco: Former Neurosurgeon  . Alcohol Use: Yes     Comment: occasional  . Drug Use: No  . Sexual Activity: Not on file   Other Topics Concern  . Not on file   Social History Narrative   Married, has a teenage daughter.   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   Grew up in [place], attended [employer], [employer] (masters).   He is a triathelete: runs, swims, bikes regularly.   No T/A/Ds.    Outpatient Prescriptions Prior to Visit  Medication Sig Dispense Refill  . triamcinolone (NASACORT AQ) 55 MCG/ACT AERO nasal inhaler Place 2 sprays into the nose daily. 1 Inhaler 1  . albuterol (PROVENTIL HFA;VENTOLIN HFA) 108 (90 BASE) MCG/ACT inhaler Inhale 2 puffs into the lungs every 4 (four) hours as needed  for wheezing or shortness of breath (cough, shortness of breath or wheezing.). 1 Inhaler 1  . budesonide (PULMICORT FLEXHALER) 180 MCG/ACT inhaler Inhale 2 puffs into the lungs daily. 1 Inhaler 12  . albuterol (PROVENTIL,VENTOLIN) 90 MCG/ACT inhaler Inhale 2 puffs into the lungs every 4 (four) hours as needed for wheezing. 17 g 1  . Omega-3 Fatty Acids (FISH OIL) 1000 MG CAPS Take 2 capsules by mouth daily. Reported on 05/06/2015     No facility-administered medications prior to visit.    Allergies  Allergen Reactions  . Penicillins     ROS Review of Systems  Constitutional: Negative for fever, chills, appetite change and fatigue.  HENT: Negative for congestion, dental problem, ear pain and sore throat.   Eyes: Negative for discharge, redness and visual disturbance.  Respiratory: Negative for cough, chest tightness, shortness of breath and wheezing.   Cardiovascular: Negative for chest pain, palpitations and leg swelling.  Gastrointestinal: Negative for nausea, vomiting, abdominal pain, diarrhea and blood in stool.  Genitourinary: Negative for dysuria, urgency, frequency, hematuria, flank pain and difficulty urinating.  Musculoskeletal: Negative for myalgias, back pain, joint swelling, arthralgias and neck stiffness.  Skin: Negative for pallor and rash.  Neurological: Negative for dizziness, speech difficulty, weakness and headaches.  Hematological: Negative for adenopathy. Does not bruise/bleed easily.  Psychiatric/Behavioral: Negative for confusion and sleep disturbance. The patient is not nervous/anxious.     PE; Blood pressure 130/83, pulse 71, temperature 98.1 F (36.7 C), temperature source Oral, resp. rate 16, height 5' 8.25" (1.734 m), weight 186 lb 8 oz (84.596 kg), SpO2 95 %. Gen: Alert, well appearing.  Patient is oriented to person, place, time, and situation. AFFECT: pleasant, lucid thought and speech. ENT: Ears: EACs clear, normal epithelium.  TMs with good light  reflex and landmarks bilaterally.  Eyes: no injection, icteris, swelling, or exudate.  EOMI, PERRLA. Nose: no drainage or turbinate edema/swelling.  No injection or focal lesion.  Mouth: lips without lesion/swelling.  Oral mucosa pink and moist.  Dentition intact and without obvious caries or gingival swelling.  Oropharynx without erythema, exudate, or swelling.  Neck: supple/nontender.  No LAD, mass, or TM.  Carotid pulses 2+ bilaterally, without bruits. CV: RRR, no m/r/g.   LUNGS: CTA bilat, nonlabored resps, good aeration in all lung fields. ABD: soft, NT, ND, BS normal.  No hepatospenomegaly or mass.  No bruits. EXT: no clubbing, cyanosis, or edema.  Musculoskeletal: no joint swelling, erythema, warmth, or tenderness.  ROM of all joints intact. Skin - no sores or suspicious lesions or rashes or color changes  Pertinent labs:  Lab Results  Component Value Date   TSH 1.59 05/02/2014   Lab Results  Component Value Date   WBC 5.3 05/02/2014   HGB 14.0 05/02/2014   HCT 43.0 05/02/2014   MCV 97.5 05/02/2014   PLT 192.0 05/02/2014   Lab Results  Component Value Date   CREATININE 0.8 05/02/2014   BUN 11 05/02/2014   NA 138 05/02/2014   K 5.2* 05/02/2014   CL 102 05/02/2014   CO2 25 05/02/2014   Lab Results  Component Value Date   ALT 20 05/02/2014   AST 31 05/02/2014   ALKPHOS 54 05/02/2014   BILITOT 0.8 05/02/2014   Lab Results  Component Value Date   CHOL 244* 05/02/2014   Lab Results  Component Value Date   HDL 59.70 05/02/2014   Lab Results  Component Value Date   LDLCALC 173* 05/02/2014   Lab Results  Component Value Date   TRIG 58.0 05/02/2014   Lab Results  Component Value Date   CHOLHDL 4 05/02/2014   ASSESSMENT AND PLAN:   Health maintenance exam:  Reviewed age and gender appropriate health maintenance issues (prudent diet, regular exercise, health risks of tobacco and excessive alcohol, use of seatbelts, fire alarms in home, use of sunscreen).  Also  reviewed age and gender appropriate health screening as well as vaccine recommendations. He declined flu vaccine today. Fasting HP labs drawn today.  An After Visit Summary was printed and given to the patient.  FOLLOW UP:  Return in about 1 year (around 05/05/2016) for annual CPE (fasting).

## 2016-05-06 ENCOUNTER — Encounter: Payer: PRIVATE HEALTH INSURANCE | Admitting: Family Medicine
